# Patient Record
Sex: Male | Born: 1990 | Hispanic: Yes | Marital: Single | Smoking: Never smoker
Health system: Southern US, Community
[De-identification: ages and names within clinical notes are randomized; demographics above are authoritative.]

## PROBLEM LIST (undated history)

## (undated) HISTORY — PX: APPENDECTOMY: SHX54

---

## 2015-10-30 ENCOUNTER — Emergency Department: Payer: Managed Care, Other (non HMO)

## 2015-10-30 ENCOUNTER — Emergency Department
Admission: EM | Admit: 2015-10-30 | Discharge: 2015-10-30 | Disposition: A | Discharge status: Home / Self Care | Payer: Managed Care, Other (non HMO) | Attending: Emergency Medicine | Admitting: Emergency Medicine

## 2015-10-30 ENCOUNTER — Encounter: Payer: Self-pay | Admitting: Emergency Medicine

## 2015-10-30 DIAGNOSIS — Y939 Activity, unspecified: Secondary | ICD-10-CM | POA: Insufficient documentation

## 2015-10-30 DIAGNOSIS — Y999 Unspecified external cause status: Secondary | ICD-10-CM | POA: Insufficient documentation

## 2015-10-30 DIAGNOSIS — S4992XA Unspecified injury of left shoulder and upper arm, initial encounter: Secondary | ICD-10-CM | POA: Diagnosis present

## 2015-10-30 DIAGNOSIS — W19XXXA Unspecified fall, initial encounter: Secondary | ICD-10-CM | POA: Diagnosis not present

## 2015-10-30 DIAGNOSIS — S43025A Posterior dislocation of left humerus, initial encounter: Secondary | ICD-10-CM | POA: Insufficient documentation

## 2015-10-30 DIAGNOSIS — Y929 Unspecified place or not applicable: Secondary | ICD-10-CM | POA: Insufficient documentation

## 2015-10-30 DIAGNOSIS — S43005A Unspecified dislocation of left shoulder joint, initial encounter: Secondary | ICD-10-CM

## 2015-10-30 MED ORDER — SODIUM CHLORIDE 0.9 % IV BOLUS (SEPSIS)
1000.0000 mL | Freq: Once | INTRAVENOUS | Status: AC
  Administered 2015-10-30: 1000 mL via INTRAVENOUS

## 2015-10-30 MED ORDER — MORPHINE SULFATE (PF) 4 MG/ML IV SOLN
INTRAVENOUS | Status: DC
Start: 2015-10-30 — End: 2015-10-30
  Filled 2015-10-30: qty 1

## 2015-10-30 MED ORDER — FENTANYL CITRATE (PF) 100 MCG/2ML IJ SOLN
INTRAMUSCULAR | Status: AC | PRN
  Administered 2015-10-30 (×2): 50 ug via INTRAVENOUS

## 2015-10-30 MED ORDER — ONDANSETRON HCL 4 MG/2ML IJ SOLN
4.0000 mg | Freq: Once | INTRAMUSCULAR | Status: AC
  Administered 2015-10-30: 4 mg via INTRAVENOUS
  Filled 2015-10-30: qty 2

## 2015-10-30 MED ORDER — MORPHINE SULFATE (PF) 4 MG/ML IV SOLN
4.0000 mg | Freq: Once | INTRAVENOUS | Status: AC
  Administered 2015-10-30: 4 mg via INTRAVENOUS
  Filled 2015-10-30: qty 1

## 2015-10-30 MED ORDER — FLUMAZENIL 0.5 MG/5ML IV SOLN
INTRAVENOUS | Status: AC | PRN
  Administered 2015-10-30: .25 mg via INTRAVENOUS

## 2015-10-30 MED ORDER — FENTANYL CITRATE (PF) 100 MCG/2ML IJ SOLN
INTRAMUSCULAR | Status: AC
  Filled 2015-10-30: qty 4

## 2015-10-30 MED ORDER — FLUMAZENIL 0.5 MG/5ML IV SOLN
INTRAVENOUS | Status: AC
  Filled 2015-10-30: qty 5

## 2015-10-30 MED ORDER — MIDAZOLAM HCL 2 MG/2ML IJ SOLN
INTRAMUSCULAR | Status: AC | PRN
  Administered 2015-10-30: 2 mg via INTRAVENOUS
  Administered 2015-10-30: 1 mg via INTRAVENOUS

## 2015-10-30 MED ORDER — NALOXONE HCL 2 MG/2ML IJ SOSY
PREFILLED_SYRINGE | INTRAMUSCULAR | Status: AC
  Filled 2015-10-30: qty 2

## 2015-10-30 MED ORDER — MIDAZOLAM HCL 5 MG/5ML IJ SOLN
INTRAMUSCULAR | Status: AC | PRN
  Administered 2015-10-30: 2 mg via INTRAVENOUS

## 2015-10-30 MED ORDER — MORPHINE SULFATE (PF) 4 MG/ML IV SOLN
4.0000 mg | Freq: Once | INTRAVENOUS | Status: AC
  Administered 2015-10-30: 4 mg via INTRAVENOUS

## 2015-10-30 MED ORDER — MIDAZOLAM HCL 5 MG/5ML IJ SOLN
INTRAMUSCULAR | Status: AC
  Filled 2015-10-30: qty 15
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
## 2015-10-30 NOTE — Sedation Documentation (Signed)
Family updated as to patient's status.

## 2015-10-30 NOTE — ED Notes (Signed)
Pt says pain has eased just a little; rates 7/10
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
## 2015-10-30 NOTE — ED Notes (Signed)
Pt using call bell; says pain back up to 10/10; MD notified and order given for more morphine

## 2015-10-30 NOTE — Discharge Instructions (Signed)
How to Use a Shoulder Immobilizer °A shoulder immobilizer is a device that you may have to wear after a shoulder injury or surgery. This device keeps your arm from moving. This prevents additional pain or injury. It also supports your arm next to your body as your shoulder heals. °You may need to wear a shoulder immobilizer to treat a broken bone (fracture) in your shoulder. You may also need to wear one if you have an injury that moves your shoulder out of position (dislocation). There are different types of shoulder immobilizers. The one that you get depends on your injury. °RISKS AND COMPLICATIONS °Wearing a shoulder immobilizer in the wrong way can let your injured shoulder move around too much. This may delay healing and make your pain and swelling worse. °HOW TO USE YOUR SHOULDER IMMOBILIZER °· The part of the immobilizer that goes around your neck (sling) should support your upper arm, with your elbow bent and your lower arm and hand across your chest. °· Make sure that your elbow: °· Is snug against the back pocket of the sling. °· Does not move away from your body. °· The strap of the immobilizer should go over your shoulder and support your arm and hand. Your hand should be slightly higher than your elbow. It should not hang loosely over the edge of the sling. °· If the long strap has a pad, place it where it is most comfortable on your neck. °· Carefully follow your health care provider's instructions for wearing your shoulder immobilizer. Your health care provider may want you to: °· Loosen your immobilizer to straighten your elbow and move your wrist and fingers. You may have to do this several times each day. Ask your health care provider when you should do this and how often. °· Remove your immobilizer once every day to shower, but limit the movement in your injured arm. Before putting the immobilizer back on, use a towel to dry the area under your arm completely. °· Remove your immobilizer to do  shoulder exercises at home as directed by your health care provider. °· Wear your immobilizer while you sleep. You may sleep more comfortably if you have your upper body raised on pillows. °SEEK MEDICAL CARE IF: °· Your immobilizer is not supporting your arm properly. °· Your immobilizer gets damaged. °· You have worsening pain or swelling in your shoulder, arm, or hand. °· Your shoulder, arm, or hand changes color or temperature. °· You lose feeling in your shoulder, arm, or hand. °  °This information is not intended to replace advice given to you by your health care provider. Make sure you discuss any questions you have with your health care provider. °  °Document Released: 07/26/2004 Document Revised: 11/02/2014 Document Reviewed: 05/26/2014 °Elsevier Interactive Patient Education ©2016 [employer] ° °Shoulder Dislocation °A shoulder dislocation happens when the upper arm bone (humerus) moves out of the shoulder joint. The shoulder joint is the part of the shoulder where the humerus, shoulder blade (scapula), and collarbone (clavicle) meet. °CAUSES °This condition is often caused by: °· A fall. °· A hit to the shoulder. °· A forceful movement of the shoulder. °RISK FACTORS °This condition is more likely to develop in people who play sports. °SYMPTOMS °Symptoms of this condition include: °· Deformity of the shoulder. °· Intense pain. °· Inability to move the shoulder. °· Numbness, weakness, or tingling in your neck or down your arm. °· Bruising or swelling around your shoulder. °DIAGNOSIS °This condition is diagnosed with   a physical exam. After the exam, tests may be done to check for related problems. Tests that may be done include: °· X-ray. This may be done to check for broken bones. °· MRI. This may be done to check for damage to the tissues around the shoulder. °· Electromyogram. This may be done to check for nerve damage. °TREATMENT °This condition is treated with a procedure to place the humerus back in  the joint. This procedure is called a reduction. There are two types of reduction: °· Closed reduction. In this procedure, the humerus is placed back in the joint without surgery. The health care provider uses his or her hands to guide the bone back into place. °· Open reduction. In this procedure, the humerus is placed back in the joint with surgery. An open reduction may be recommended if: °¨ You have a weak shoulder joint or weak ligaments. °¨ You have had more than one shoulder dislocation. °¨ The nerves or blood vessels around your shoulder have been damaged. °After the humerus is placed back into the joint, your arm will be placed in a splint or sling to prevent it from moving. You will need to wear the splint or sling until your shoulder heals. When the splint or sling is removed, you may have physical therapy to help improve the range of motion in your shoulder joint. °HOME CARE INSTRUCTIONS °If You Have a Splint or Sling: °· Wear it as told by your health care provider. Remove it only as told by your health care provider. °· Loosen it if your fingers become numb and tingle, or if they turn cold and blue. °· Keep it clean and dry. °Bathing °· Do not take baths, swim, or use a hot tub until your health care provider approves. Ask your health care provider if you can take showers. You may only be allowed to take sponge baths for bathing. °· If your health care provider approves bathing and showering, cover your splint or sling with a watertight plastic bag to protect it from water. Do not let the splint or sling get wet. °Managing Pain, Stiffness, and Swelling °· If directed, apply ice to the injured area. °¨ Put ice in a plastic bag. °¨ Place a towel between your skin and the bag. °¨ Leave the ice on for 20 minutes, 2-3 times per day. °· Move your fingers often to avoid stiffness and to decrease swelling. °· Raise (elevate) the injured area above the level of your heart while you are sitting or lying  down. °Driving °· Do not drive while wearing a splint or sling on a hand that you use for driving. °· Do not drive or operate heavy machinery while taking pain medicine. °Activity °· Return to your normal activities as told by your health care provider. Ask your health care provider what activities are safe for you. °· Perform range-of-motion exercises only as told by your health care provider. °· Exercise your hand by squeezing a soft ball. This helps to decrease stiffness and swelling in your hand and wrist. °General Instructions °· Take over-the-counter and prescription medicines only as told by your health care provider. °· Do not use any tobacco products, including cigarettes, chewing tobacco, or e-cigarettes. Tobacco can delay bone and tissue healing. If you need help quitting, ask your health care provider. °· Keep all follow-up visits as told by your health care provider. This is important. °SEEK MEDICAL CARE IF: °· Your splint or sling gets damaged. °SEEK IMMEDIATE MEDICAL CARE   IF:  Your pain gets worse rather than better.  You lose feeling in your arm or hand.  Your arm or hand becomes white and cold.   This information is not intended to replace advice given to you by your health care provider. Make sure you discuss any questions you have with your health care provider.   Document Released: 03/13/2001 Document Revised: 03/09/2015 Document Reviewed: 10/11/2014 Elsevier Interactive Patient Education 2016 [employer]   Wear the shoulder immobilizer as instructed she can take it off to shower. Do not raise her arm above her head however. Call Dr. Hyacinth MeekerMiller the orthopedic doctor on Monday to arrange follow-up in about a week. Use Motrin for the over-the-counter pills 3 times a day as needed for pain.

## 2015-10-30 NOTE — ED Notes (Addendum)
Pt ambulated to bathroom. Alert and oriented x4.

## 2015-10-30 NOTE — ED Notes (Signed)
Verbal report to Donald, RN 

## 2015-10-30 NOTE — ED Notes (Signed)
Portable xray complete by Link SnufferEddie

## 2015-10-30 NOTE — ED Notes (Signed)
Pt placed on cardiac monitor and oxygen 2L via [place], preparing for relocation of shoulder;

## 2017-08-02 IMAGING — DX DG SHOULDER 2+V*L*
2 series · 2 of 2 positions shown · non-contrast
Comparison: 10/30/2015

CLINICAL DATA: Post-reduction, no axillary image per physician
request. Pt had a posterior dislocation. Shielded. Best images
obtained.

EXAM:
LEFT SHOULDER - 2+ VIEW

[shoulder ap]
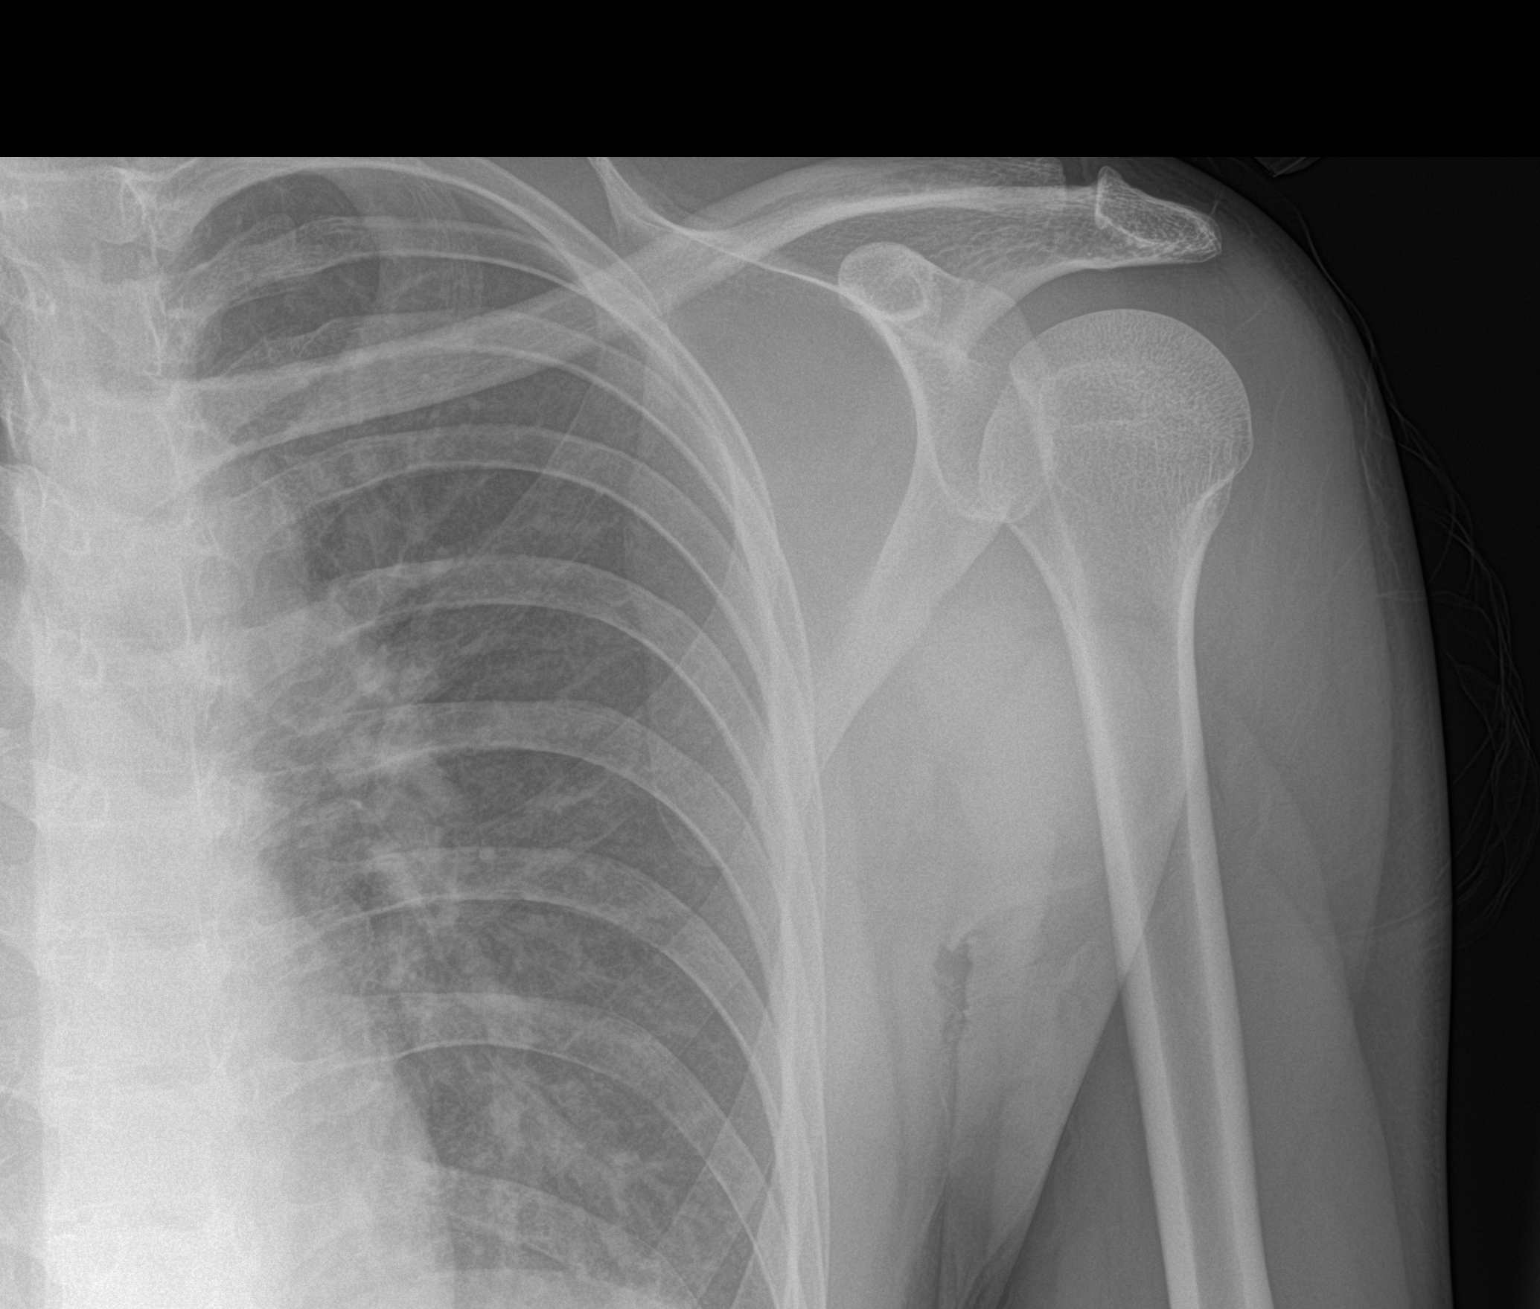

[shoulder obl]
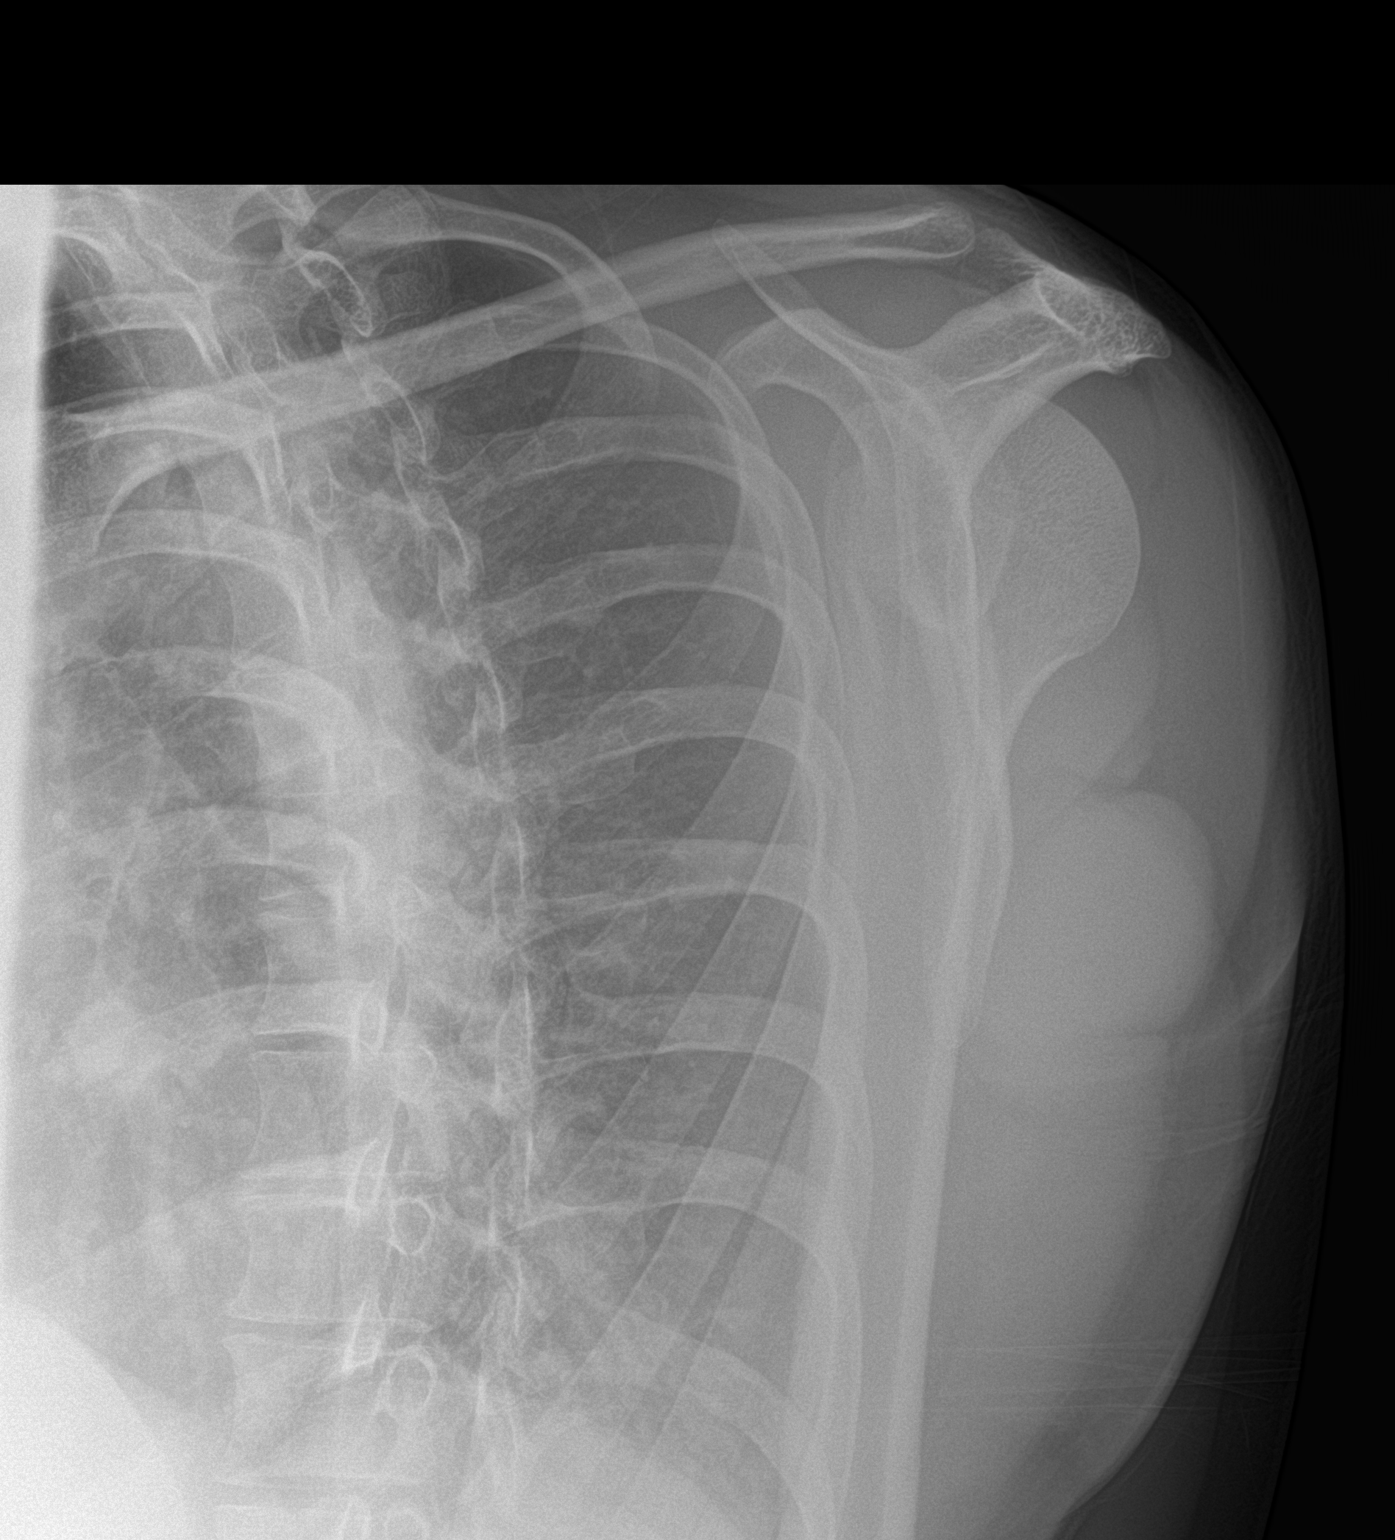

[2 of 2 positions shown; findings below may reference images not displayed]

FINDINGS: Humeral head now appears in anticipated position relative to the
glenoid. No acute fracture identified.
IMPRESSION: Status post reduction of prior posterior dislocation
